# Patient Record
Sex: Male | Born: 1995 | Hispanic: No | Marital: Single | Smoking: Current some day smoker
Health system: Southern US, Community
[De-identification: ages and names within clinical notes are randomized; demographics above are authoritative.]

---

## 2016-05-09 ENCOUNTER — Emergency Department
Admission: EM | Admit: 2016-05-09 | Discharge: 2016-05-09 | Disposition: A | Discharge status: Home / Self Care | Payer: 59 | Attending: Emergency Medicine | Admitting: Emergency Medicine

## 2016-05-09 ENCOUNTER — Encounter: Payer: Self-pay | Admitting: Emergency Medicine

## 2016-05-09 ENCOUNTER — Emergency Department: Payer: 59

## 2016-05-09 DIAGNOSIS — Y998 Other external cause status: Secondary | ICD-10-CM | POA: Diagnosis not present

## 2016-05-09 DIAGNOSIS — Y929 Unspecified place or not applicable: Secondary | ICD-10-CM | POA: Diagnosis not present

## 2016-05-09 DIAGNOSIS — Y9368 Activity, volleyball (beach) (court): Secondary | ICD-10-CM | POA: Diagnosis not present

## 2016-05-09 DIAGNOSIS — F17200 Nicotine dependence, unspecified, uncomplicated: Secondary | ICD-10-CM | POA: Diagnosis not present

## 2016-05-09 DIAGNOSIS — W2106XA Struck by volleyball, initial encounter: Secondary | ICD-10-CM | POA: Insufficient documentation

## 2016-05-09 DIAGNOSIS — S4991XA Unspecified injury of right shoulder and upper arm, initial encounter: Secondary | ICD-10-CM | POA: Diagnosis present

## 2016-05-09 DIAGNOSIS — S43034A Inferior dislocation of right humerus, initial encounter: Secondary | ICD-10-CM | POA: Diagnosis not present

## 2016-05-09 MED ORDER — LIDOCAINE HCL (PF) 1 % IJ SOLN
10.0000 mL | Freq: Once | INTRAMUSCULAR | Status: AC
  Administered 2016-05-09: 10 mL

## 2016-05-09 MED ORDER — FENTANYL CITRATE (PF) 100 MCG/2ML IJ SOLN
50.0000 ug | INTRAMUSCULAR | Status: AC | PRN
  Administered 2016-05-09 (×2): 50 ug via INTRAVENOUS
  Filled 2016-05-09: qty 2

## 2016-05-09 MED ORDER — LIDOCAINE HCL 2 % IJ SOLN
10.0000 mL | Freq: Once | INTRAMUSCULAR | Status: DC
  Filled 2016-05-09: qty 10

## 2016-05-09 MED ORDER — LIDOCAINE HCL (PF) 1 % IJ SOLN
INTRAMUSCULAR | Status: AC
  Administered 2016-05-09: 10 mL
  Filled 2016-05-09: qty 10

## 2016-05-09 MED ORDER — FENTANYL CITRATE (PF) 100 MCG/2ML IJ SOLN
INTRAMUSCULAR | Status: AC
  Administered 2016-05-09: 50 ug via INTRAVENOUS
  Filled 2016-05-09: qty 2

## 2016-05-09 NOTE — ED Provider Notes (Signed)
[facility] Emergency Department Provider Note  ____________________________________________  Time seen: Approximately 3:36 PM  I have reviewed the triage vital signs and the nursing notes.   HISTORY  Chief Complaint Shoulder Pain   HPI [place] Lucianne MussKumar is a 20 y.o. male no significant past medical history who presents for evaluation of right shoulder pain. Patient was playing volleyball and dove into a ball and developed sudden onset of right shoulder pain and inability to adduct his arm. No prior history of dislocation or fracture. Patient is complaining of moderate pain worse with movement, located in the right shoulder, nonradiating, and constant since the fall. That happened just prior to arrival. Patient denies head injury, neck pain, back pain, chest pain, abdominal pain, pain in any other extremity. He denies numbness or weakness of his arm.  History reviewed. No pertinent past medical history.  There are no active problems to display for this patient.   History reviewed. No pertinent surgical history.  Prior to Admission medications   Not on File    Allergies Patient has no known allergies.  No family history on file.  Social History Social History  Substance Use Topics  . Smoking status: Current Some Day Smoker  . Smokeless tobacco: Never Used  . Alcohol use Yes    Review of Systems  Constitutional: Negative for fever. Eyes: Negative for visual changes. ENT: Negative for sore throat. Cardiovascular: Negative for chest pain. Respiratory: Negative for shortness of breath. Gastrointestinal: Negative for abdominal pain, vomiting or diarrhea. Genitourinary: Negative for dysuria. Musculoskeletal: Negative for back pain. + R shoulder pain Skin: Negative for rash. Neurological: Negative for headaches, weakness or numbness.  ____________________________________________   PHYSICAL EXAM:  VITAL SIGNS: ED Triage Vitals [05/09/16 1342]    Enc Vitals Group     BP (!) 198/159     Pulse Rate (!) 102     Resp 18     Temp 98.6 F (37 C)     Temp Source Oral     SpO2 100 %     Weight 158 lb 11.7 oz (72 kg)     Height 6' (1.829 m)     Head Circumference      Peak Flow      Pain Score 7     Pain Loc      Pain Edu?      Excl. in GC?    Constitutional: Alert and oriented. No acute distress. Does not appear intoxicated. HEENT Head: Normocephalic and atraumatic. Face: No facial bony tenderness. Stable midface Ears: No hemotympanum bilaterally. No Battle sign Eyes: No eye injury. PERRL. No raccoon eyes Nose: Nontender. No epistaxis. No rhinorrhea Mouth/Throat: Mucous membranes are moist. No oropharyngeal blood. No dental injury. Airway patent without stridor. Normal voice. Neck: no C-collar in place. No midline c-spine tenderness.  Cardiovascular: Normal rate, regular rhythm. Normal and symmetric distal pulses are present in all extremities. Pulmonary/Chest: Chest wall is stable and nontender to palpation/compression. Normal respiratory effort. Breath sounds are normal. No crepitus.  Abdominal: Soft, nontender, non distended. Musculoskeletal: R arm is fully extended above patient's head and abducted. Obvious deformity at the shoulder joint. Normal strong Radial pulse, intact sensation of the deltoid, ulnar, median, radial nerves areas. Nontender with normal full range of motion in all other extremities. No deformities. No thoracic or lumbar midline spinal tenderness. Pelvis is stable. Skin: Skin is warm, dry and intact. No abrasions or contutions. Psychiatric: Speech and behavior are appropriate. Neurological: Normal speech and language. Moves  all extremities to command. No gross focal neurologic deficits are appreciated.   ____________________________________________   LABS (all labs ordered are listed, but only abnormal results are displayed)  Labs Reviewed - No data to  display ____________________________________________  EKG  none  ____________________________________________  RADIOLOGY  XR R shoulder:  Shoulder dislocation  ____________________________________________   PROCEDURES  Procedure(s) performed:yes Procedures   Reduction of dislocation Date/Time: 4:58 PM Performed by: Nita Sicklearolina Shaunette Gassner Authorized by: Nita Sicklearolina Kenda Kloehn Consent: Verbal consent obtained. Risks and benefits: risks, benefits and alternatives were discussed Consent given by: patient Required items: required blood products, implants, devices, and special equipment available Time out: Immediately prior to procedure a "time out" was called to verify the correct patient, procedure, equipment, support staff and site/side marked as required.  Patient sedated: with hematoma block with 1% lidocaine and fentanyl 50 mcg IV  Vitals: Vital signs were monitored during sedation. Patient tolerance: Patient tolerated the procedure well with no immediate complications. Joint: R shoulder Reduction technique: traction counter traction   Critical Care performed:  None ____________________________________________   INITIAL IMPRESSION / ASSESSMENT AND PLAN / ED COURSE   20 y.o. male no significant past medical history who presents for evaluation of right shoulder pain. Patient with inferior dislocation of the right shoulder. Was reduced successfully with a hematoma block and IV fentanyl without conscious sedation. Patient tolerated the procedure well. Right upper extremity neurovascularly intact prior and post reduction with normal sensation over the deltoid. Patient was placed on a shoulder immobilizer. Postreduction films show a successful reduction. Patient will be discharged with close follow-up with Dr. Joice LoftsPoggi  Clinical Course     Pertinent labs & imaging results that were available during my care of the patient were reviewed by me and considered in my medical decision making  (see chart for details).    ____________________________________________   FINAL CLINICAL IMPRESSION(S) / ED DIAGNOSES  Final diagnoses:  Inferior dislocation of right shoulder, initial encounter      NEW MEDICATIONS STARTED DURING THIS VISIT:  New Prescriptions   No medications on file     Note:  This document was prepared using Dragon voice recognition software and may include unintentional dictation errors.    Nita Sicklearolina Theophil Thivierge, MD 05/09/16 [phone]

## 2016-05-09 NOTE — Discharge Instructions (Signed)
Keep your arm in the shoulder immobilizer. Do multiple times a day exercises with your elbow and wrist as I showed it to you to prevent them from becoming frozen. Do not use your shoulder until cleared by orthopedics. Call Dr. Binnie RailPoggi's office on Monday for an appointment Thursday or Friday this week. Take ibuprofen for pain. No sports until cleared by orthopedics doctor.

## 2016-05-09 NOTE — ED Notes (Signed)
Shoulder immobilizer applied to right shoulder by Gerilyn PilgrimJacob, ED Tech

## 2016-05-09 NOTE — ED Triage Notes (Signed)
Patient presents to the ED with severe right shoulder pain that started after patient dove during a volleyball game and hit his right shoulder.  Patient appears obviously uncomfortable, holding right arm above his shoulder decreases patient's pain.  Patient denies history of shoulder dislocation but reports history of shoulder pain.

## 2018-07-01 IMAGING — CR DG SHOULDER 2+V*R*
1 series · 2 of 2 positions shown · non-contrast
Comparison: None.

CLINICAL DATA: Pain after trauma.

EXAM:
RIGHT SHOULDER - 2+ VIEW

[Series 1: dg shoulder right · 0.14mm/px · 2 of 2 slices shown]
[im 1/2]
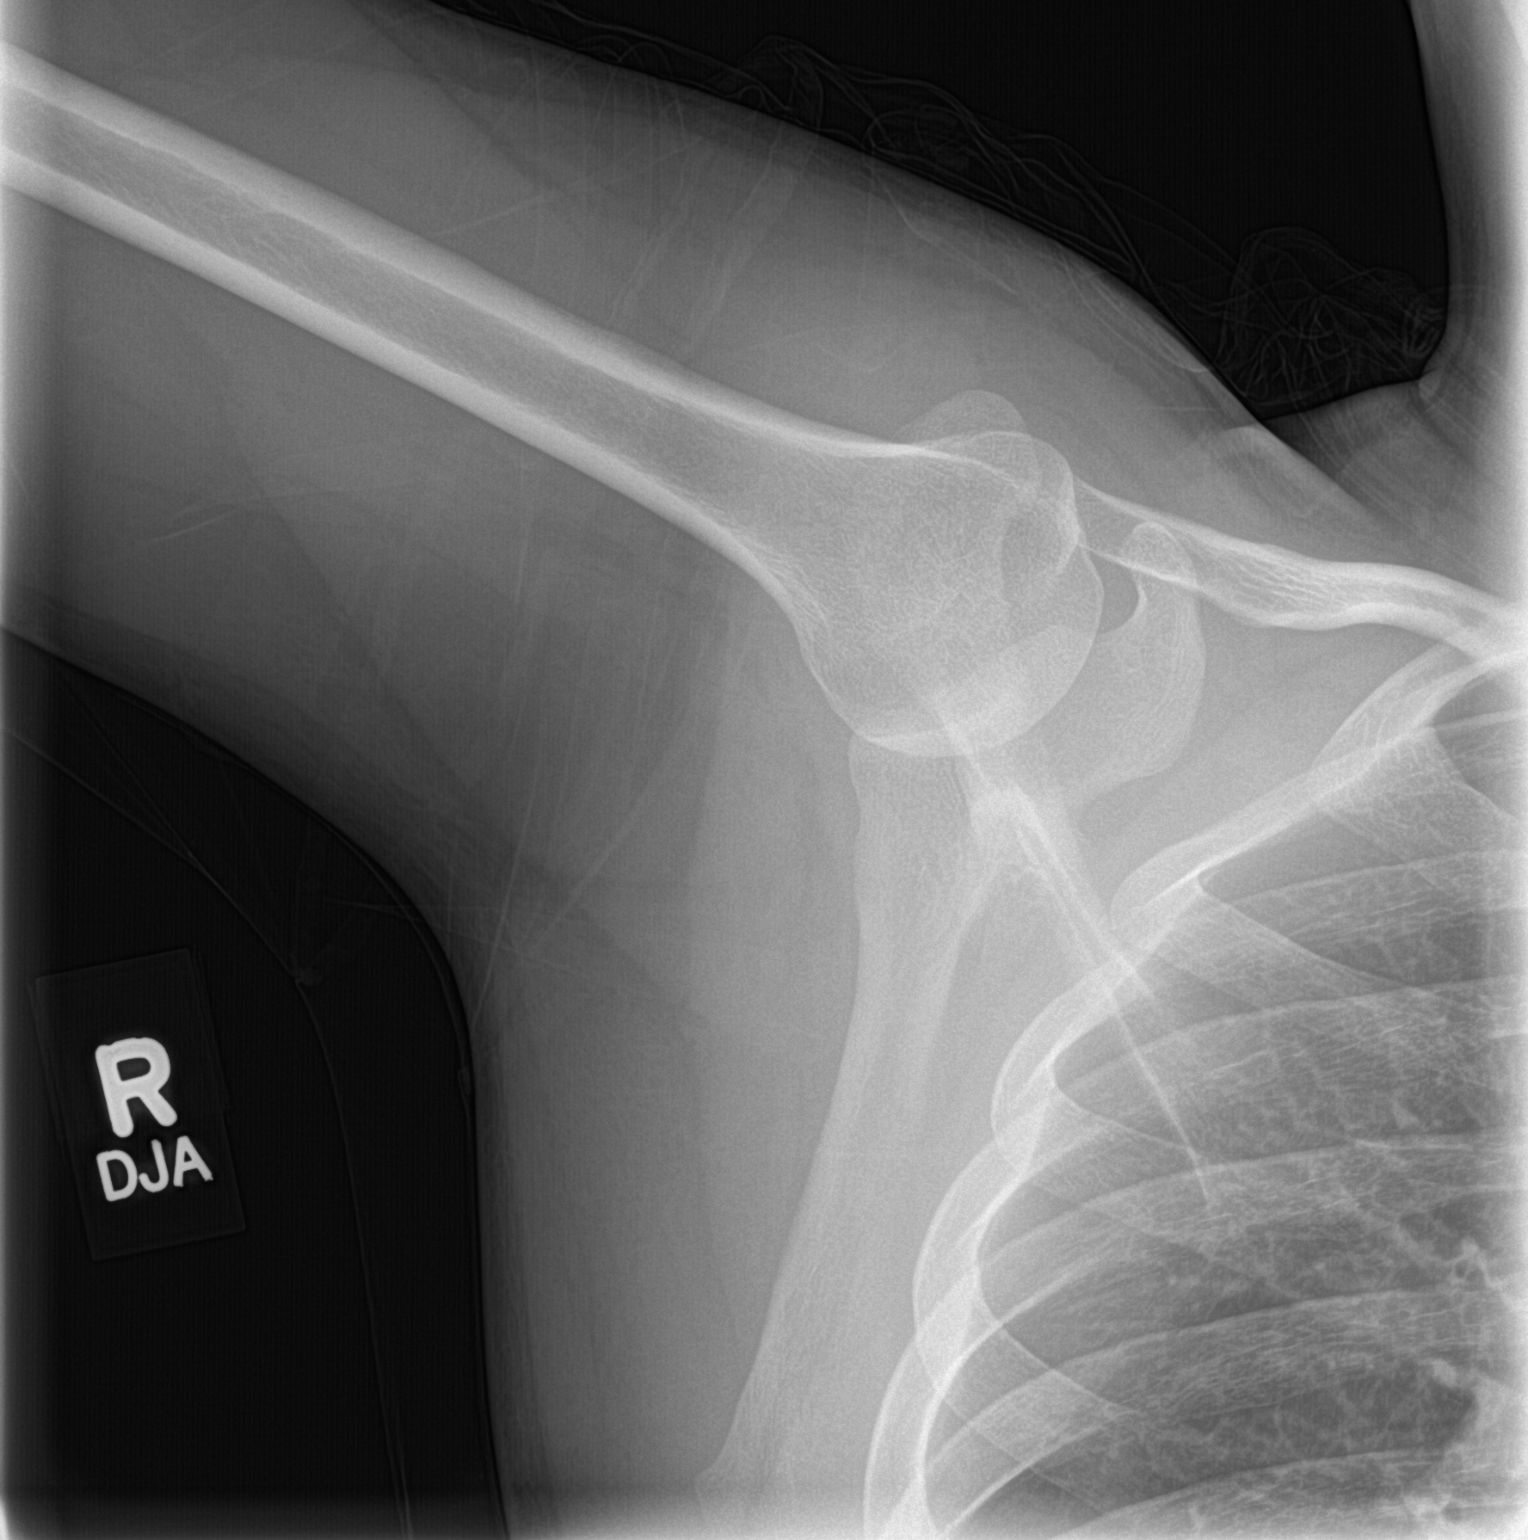
[im 2/2]
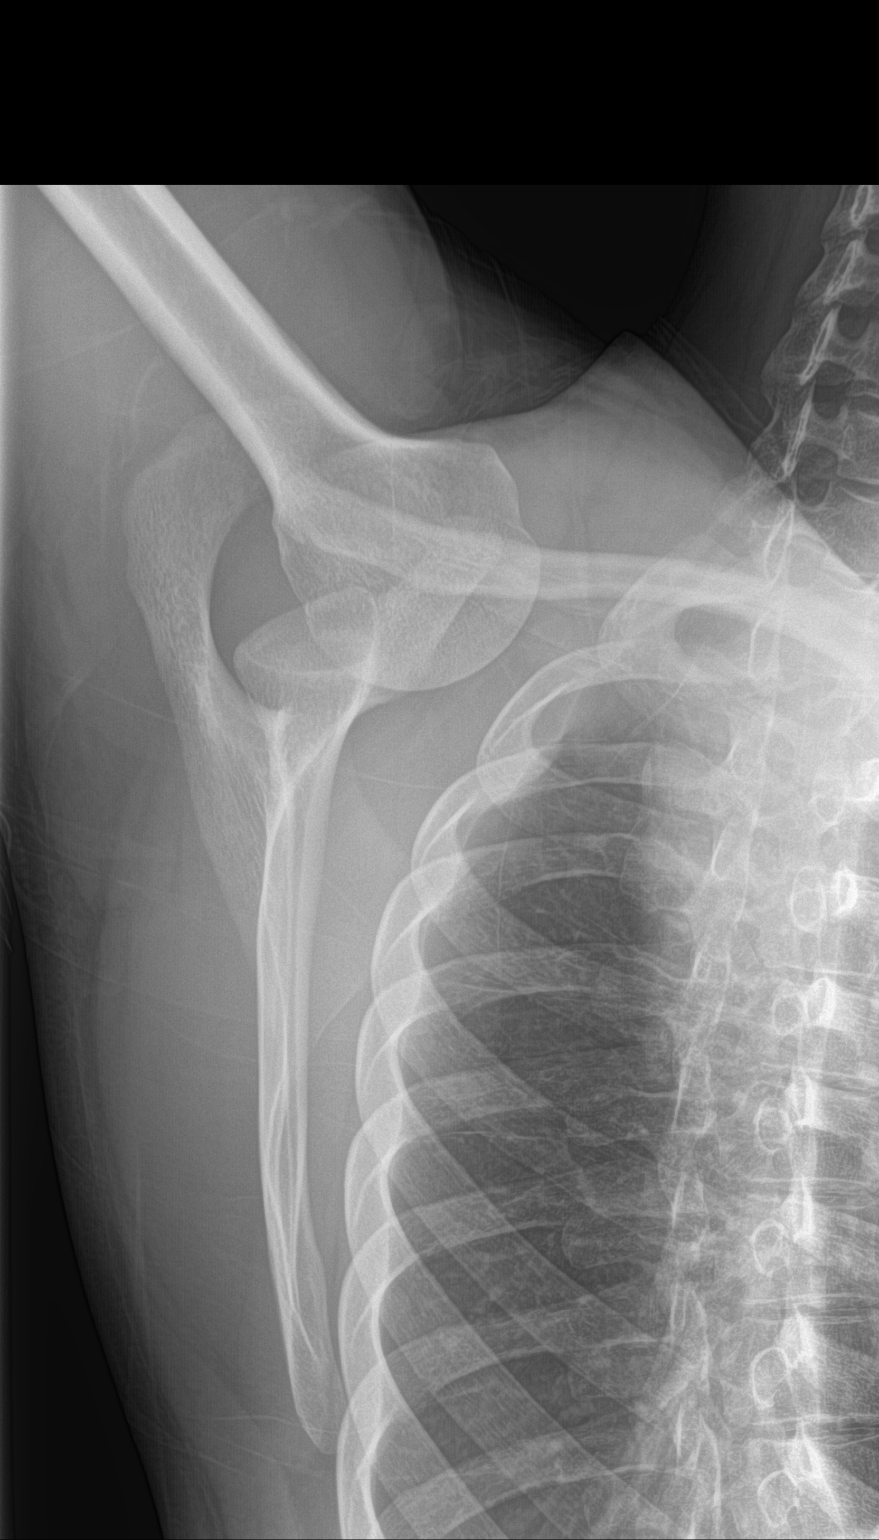

[2 of 2 positions shown; findings below may reference images not displayed]

FINDINGS: The patient's shoulder is dislocated. The humeral head overlies the
coracoid on 1 of the 2 images. While the study is limited due to
positioning, I believe the dislocation is anterior. No acute
fractures identified.
IMPRESSION: Shoulder dislocation as above.
